# Patient Record
Sex: Male | Born: 1940
Health system: Southern US, Community
[De-identification: ages and names within clinical notes are randomized; demographics above are authoritative.]

## PROBLEM LIST (undated history)

## (undated) DIAGNOSIS — T82868A Thrombosis of vascular prosthetic devices, implants and grafts, initial encounter: Secondary | ICD-10-CM

## (undated) DIAGNOSIS — I2510 Atherosclerotic heart disease of native coronary artery without angina pectoris: Secondary | ICD-10-CM

## (undated) DIAGNOSIS — I10 Essential (primary) hypertension: Secondary | ICD-10-CM

## (undated) HISTORY — PX: HERNIA REPAIR: SHX51

---

## 2015-03-22 ENCOUNTER — Inpatient Hospital Stay (HOSPITAL_COMMUNITY): Payer: Medicare Other

## 2015-03-22 ENCOUNTER — Emergency Department (HOSPITAL_COMMUNITY): Payer: Medicare Other

## 2015-03-22 ENCOUNTER — Encounter (HOSPITAL_COMMUNITY): Payer: Self-pay | Admitting: Emergency Medicine

## 2015-03-22 DIAGNOSIS — E872 Acidosis, unspecified: Secondary | ICD-10-CM | POA: Insufficient documentation

## 2015-03-22 DIAGNOSIS — J810 Acute pulmonary edema: Secondary | ICD-10-CM | POA: Diagnosis present

## 2015-03-22 DIAGNOSIS — I10 Essential (primary) hypertension: Secondary | ICD-10-CM

## 2015-03-22 DIAGNOSIS — I213 ST elevation (STEMI) myocardial infarction of unspecified site: Principal | ICD-10-CM | POA: Diagnosis present

## 2015-03-22 DIAGNOSIS — T82868A Thrombosis of vascular prosthetic devices, implants and grafts, initial encounter: Secondary | ICD-10-CM

## 2015-03-22 DIAGNOSIS — G936 Cerebral edema: Secondary | ICD-10-CM | POA: Diagnosis present

## 2015-03-22 DIAGNOSIS — N179 Acute kidney failure, unspecified: Secondary | ICD-10-CM | POA: Diagnosis present

## 2015-03-22 DIAGNOSIS — I739 Peripheral vascular disease, unspecified: Secondary | ICD-10-CM | POA: Diagnosis present

## 2015-03-22 DIAGNOSIS — G931 Anoxic brain damage, not elsewhere classified: Secondary | ICD-10-CM | POA: Diagnosis present

## 2015-03-22 DIAGNOSIS — Z79899 Other long term (current) drug therapy: Secondary | ICD-10-CM | POA: Diagnosis not present

## 2015-03-22 DIAGNOSIS — Z66 Do not resuscitate: Secondary | ICD-10-CM | POA: Diagnosis present

## 2015-03-22 DIAGNOSIS — T82897A Other specified complication of cardiac prosthetic devices, implants and grafts, initial encounter: Secondary | ICD-10-CM

## 2015-03-22 DIAGNOSIS — I469 Cardiac arrest, cause unspecified: Secondary | ICD-10-CM

## 2015-03-22 DIAGNOSIS — R579 Shock, unspecified: Secondary | ICD-10-CM

## 2015-03-22 DIAGNOSIS — Z515 Encounter for palliative care: Secondary | ICD-10-CM | POA: Diagnosis present

## 2015-03-22 DIAGNOSIS — Z7902 Long term (current) use of antithrombotics/antiplatelets: Secondary | ICD-10-CM

## 2015-03-22 DIAGNOSIS — Z955 Presence of coronary angioplasty implant and graft: Secondary | ICD-10-CM | POA: Diagnosis not present

## 2015-03-22 DIAGNOSIS — R4020 Unspecified coma: Secondary | ICD-10-CM

## 2015-03-22 DIAGNOSIS — J9601 Acute respiratory failure with hypoxia: Secondary | ICD-10-CM | POA: Diagnosis present

## 2015-03-22 DIAGNOSIS — R40243 Glasgow coma scale score 3-8, unspecified time: Secondary | ICD-10-CM | POA: Diagnosis present

## 2015-03-22 DIAGNOSIS — G935 Compression of brain: Secondary | ICD-10-CM | POA: Diagnosis present

## 2015-03-22 DIAGNOSIS — I2510 Atherosclerotic heart disease of native coronary artery without angina pectoris: Secondary | ICD-10-CM

## 2015-03-22 HISTORY — DX: Essential (primary) hypertension: I10

## 2015-03-22 HISTORY — DX: Thrombosis due to vascular prosthetic devices, implants and grafts, initial encounter: T82.868A

## 2015-03-22 HISTORY — DX: Atherosclerotic heart disease of native coronary artery without angina pectoris: I25.10

## 2015-03-22 LAB — I-STAT CG4 LACTIC ACID, ED: Lactic Acid, Venous: 12.96 mmol/L (ref 0.5–2.0)

## 2015-03-22 LAB — CBC WITH DIFFERENTIAL/PLATELET
BASOS ABS: 0 10*3/uL (ref 0.0–0.1)
Basophils Relative: 1 %
Eosinophils Absolute: 0 10*3/uL (ref 0.0–0.7)
Eosinophils Relative: 0 %
HEMATOCRIT: 39.7 % (ref 39.0–52.0)
HEMOGLOBIN: 13.4 g/dL (ref 13.0–17.0)
LYMPHS PCT: 18 %
Lymphs Abs: 1.5 10*3/uL (ref 0.7–4.0)
MCH: 34.4 pg — AB (ref 26.0–34.0)
MCHC: 33.8 g/dL (ref 30.0–36.0)
MCV: 102.1 fL — AB (ref 78.0–100.0)
MONO ABS: 0.3 10*3/uL (ref 0.1–1.0)
Monocytes Relative: 3 %
NEUTROS ABS: 6.5 10*3/uL (ref 1.7–7.7)
NEUTROS PCT: 78 %
Platelets: 93 10*3/uL — ABNORMAL LOW (ref 150–400)
RBC: 3.89 MIL/uL — ABNORMAL LOW (ref 4.22–5.81)
RDW: 13.1 % (ref 11.5–15.5)
WBC: 8.3 10*3/uL (ref 4.0–10.5)

## 2015-03-22 LAB — BASIC METABOLIC PANEL
Anion gap: 13 (ref 5–15)
BUN: 21 mg/dL — AB (ref 6–20)
CO2: 19 mmol/L — ABNORMAL LOW (ref 22–32)
CREATININE: 2.11 mg/dL — AB (ref 0.61–1.24)
Calcium: 6.2 mg/dL — CL (ref 8.9–10.3)
Chloride: 107 mmol/L (ref 101–111)
GFR, EST AFRICAN AMERICAN: 35 mL/min — AB (ref >60)
GFR, EST NON AFRICAN AMERICAN: 30 mL/min — AB (ref >60)
Glucose, Bld: 335 mg/dL — ABNORMAL HIGH (ref 65–99)
POTASSIUM: 6.3 mmol/L — AB (ref 3.5–5.1)
SODIUM: 139 mmol/L (ref 135–145)

## 2015-03-22 LAB — COMPREHENSIVE METABOLIC PANEL
ALT: 54 U/L (ref 17–63)
AST: 73 U/L — AB (ref 15–41)
Albumin: 2.7 g/dL — ABNORMAL LOW (ref 3.5–5.0)
Alkaline Phosphatase: 61 U/L (ref 38–126)
Anion gap: 20 — ABNORMAL HIGH (ref 5–15)
BUN: 19 mg/dL (ref 6–20)
CHLORIDE: 104 mmol/L (ref 101–111)
CO2: 13 mmol/L — ABNORMAL LOW (ref 22–32)
Calcium: 8.6 mg/dL — ABNORMAL LOW (ref 8.9–10.3)
Creatinine, Ser: 2.01 mg/dL — ABNORMAL HIGH (ref 0.61–1.24)
GFR calc Af Amer: 37 mL/min — ABNORMAL LOW (ref >60)
GFR, EST NON AFRICAN AMERICAN: 32 mL/min — AB (ref >60)
Glucose, Bld: 363 mg/dL — ABNORMAL HIGH (ref 65–99)
POTASSIUM: 5 mmol/L (ref 3.5–5.1)
Sodium: 137 mmol/L (ref 135–145)
Total Bilirubin: 0.6 mg/dL (ref 0.3–1.2)
Total Protein: 4.9 g/dL — ABNORMAL LOW (ref 6.5–8.1)

## 2015-03-22 LAB — I-STAT ARTERIAL BLOOD GAS, ED
Acid-base deficit: 17 mmol/L — ABNORMAL HIGH (ref 0.0–2.0)
Bicarbonate: 16.7 mEq/L — ABNORMAL LOW (ref 20.0–24.0)
O2 Saturation: 97 %
PCO2 ART: 79.2 mmHg — AB (ref 35.0–45.0)
PH ART: 6.931 — AB (ref 7.350–7.450)
TCO2: 19 mmol/L (ref 0–100)
pO2, Arterial: 149 mmHg — ABNORMAL HIGH (ref 80.0–100.0)

## 2015-03-22 LAB — BRAIN NATRIURETIC PEPTIDE: B NATRIURETIC PEPTIDE 5: 885.4 pg/mL — AB (ref 0.0–100.0)

## 2015-03-22 LAB — TROPONIN I: TROPONIN I: 8.36 ng/mL — AB (ref <0.031)

## 2015-03-22 LAB — I-STAT CHEM 8, ED
BUN: 27 mg/dL — ABNORMAL HIGH (ref 6–20)
CREATININE: 1.7 mg/dL — AB (ref 0.61–1.24)
Calcium, Ion: 1.09 mmol/L — ABNORMAL LOW (ref 1.13–1.30)
Chloride: 104 mmol/L (ref 101–111)
Glucose, Bld: 344 mg/dL — ABNORMAL HIGH (ref 65–99)
HEMATOCRIT: 43 % (ref 39.0–52.0)
HEMOGLOBIN: 14.6 g/dL (ref 13.0–17.0)
Potassium: 4.8 mmol/L (ref 3.5–5.1)
SODIUM: 134 mmol/L — AB (ref 135–145)
TCO2: 14 mmol/L (ref 0–100)

## 2015-03-22 LAB — I-STAT TROPONIN, ED: Troponin i, poc: 0.29 ng/mL (ref 0.00–0.08)

## 2015-03-22 LAB — PROTIME-INR
INR: 1.86 — ABNORMAL HIGH (ref 0.00–1.49)
INR: 2.05 — AB (ref 0.00–1.49)
PROTHROMBIN TIME: 21.3 s — AB (ref 11.6–15.2)
PROTHROMBIN TIME: 23 s — AB (ref 11.6–15.2)

## 2015-03-22 LAB — CBG MONITORING, ED: Glucose-Capillary: 308 mg/dL — ABNORMAL HIGH (ref 65–99)

## 2015-03-22 LAB — MRSA PCR SCREENING: MRSA by PCR: NEGATIVE

## 2015-03-22 LAB — APTT
APTT: 67 s — AB (ref 24–37)
aPTT: 62 seconds — ABNORMAL HIGH (ref 24–37)

## 2015-03-22 MED ORDER — MORPHINE SULFATE 25 MG/ML IV SOLN
10.0000 mg/h | INTRAVENOUS | Status: DC
  Administered 2015-03-22: 10 mg/h via INTRAVENOUS
  Filled 2015-03-22: qty 10

## 2015-03-22 MED ORDER — SODIUM BICARBONATE 8.4 % IV SOLN
INTRAVENOUS | Status: AC
  Administered 2015-03-22: 12:00:00
  Filled 2015-03-22: qty 50

## 2015-03-22 MED ORDER — SODIUM CHLORIDE 0.9 % IV SOLN: INTRAVENOUS | Status: DC

## 2015-03-22 MED ORDER — ASPIRIN 300 MG RE SUPP
300.0000 mg | RECTAL | Status: DC
Start: 2015-03-22 — End: 2015-03-22

## 2015-03-22 MED ORDER — MORPHINE BOLUS VIA INFUSION
5.0000 mg | INTRAVENOUS | Status: DC | PRN
  Filled 2015-03-22: qty 20

## 2015-03-22 MED ORDER — SODIUM CHLORIDE 0.9 % IV SOLN
INTRAVENOUS | Status: AC | PRN
  Administered 2015-03-22: 1000 mL via INTRAVENOUS
  Administered 2015-03-22: 125 mL/h via INTRAVENOUS

## 2015-03-22 MED ORDER — PANTOPRAZOLE SODIUM 40 MG IV SOLR: 40.0000 mg | Freq: Every day | INTRAVENOUS | Status: DC

## 2015-03-22 MED ORDER — SODIUM BICARBONATE 8.4 % IV SOLN: 50.0000 meq | Freq: Once | INTRAVENOUS | Status: DC

## 2015-03-22 MED ORDER — SODIUM BICARBONATE 8.4 % IV SOLN
INTRAVENOUS | Status: DC
  Administered 2015-03-22: 14:00:00 via INTRAVENOUS
  Filled 2015-03-22 (×2): qty 150

## 2015-03-22 MED ORDER — NOREPINEPHRINE BITARTRATE 1 MG/ML IV SOLN
0.0000 ug/min | INTRAVENOUS | Status: DC
  Administered 2015-03-22: 10 ug/min via INTRAVENOUS
  Filled 2015-03-22: qty 4

## 2015-03-22 MED ORDER — SODIUM CHLORIDE 0.9 % IV SOLN: 2000.0000 mL | Freq: Once | INTRAVENOUS | Status: DC

## 2015-03-22 MED ORDER — MIDAZOLAM HCL 2 MG/2ML IJ SOLN: 1.0000 mg | INTRAMUSCULAR | Status: DC | PRN

## 2015-03-22 MED ORDER — INSULIN ASPART 100 UNIT/ML ~~LOC~~ SOLN: 0.0000 [IU] | SUBCUTANEOUS | Status: DC

## 2015-03-22 MED ORDER — FENTANYL CITRATE (PF) 100 MCG/2ML IJ SOLN: 50.0000 ug | INTRAMUSCULAR | Status: DC | PRN

## 2015-03-22 MED ORDER — EPINEPHRINE HCL 0.1 MG/ML IJ SOSY
PREFILLED_SYRINGE | INTRAMUSCULAR | Status: AC | PRN
  Administered 2015-03-22: 0.5 mg via INTRAVENOUS
  Administered 2015-03-22: 1 mg via INTRAVENOUS
  Administered 2015-03-22: 0.5 mg via INTRAVENOUS
  Administered 2015-03-22: 1 via INTRAVENOUS
  Administered 2015-03-22 (×4): 0.5 mg via INTRAVENOUS
  Administered 2015-03-22: 1 via INTRAVENOUS

## 2015-03-22 MED ORDER — NOREPINEPHRINE BITARTRATE 1 MG/ML IV SOLN
0.0000 ug/min | INTRAVENOUS | Status: DC
  Administered 2015-03-22: 5 ug/min via INTRAVENOUS
  Filled 2015-03-22: qty 4

## 2015-03-22 MED ORDER — EPINEPHRINE HCL 1 MG/ML IJ SOLN
0.5000 ug/min | INTRAVENOUS | Status: DC
  Administered 2015-03-22: 10 ug/min via INTRAVENOUS
  Filled 2015-03-22 (×2): qty 4

## 2015-03-22 MED FILL — Medication: Qty: 1 | Status: AC

## 2015-03-23 LAB — HEMOGLOBIN A1C
Hgb A1c MFr Bld: 6.1 % — ABNORMAL HIGH (ref 4.8–5.6)
Mean Plasma Glucose: 128 mg/dL

## 2015-03-31 ENCOUNTER — Telehealth: Payer: Self-pay

## 2015-03-31 NOTE — Telephone Encounter (Signed)
On 03/31/2015 I received a death certificate from [employer] and [place]. The death certificate is for cremation.The patient is a a patient of Buyer, retailDoctor Ramaswamy.  The death certificate will be taken to the pulmonary unit at [facility] this am for signature. On 03/31/2015 I received the death certificate back from Doctor Ramaswamy. I got the death certificate ready and called the funeral home to let them know the death certificate is ready for pickup and also faxed them a copy per their request.

## 2015-04-21 NOTE — Code Documentation (Signed)
Family updated as to patient's status by MD Hyacinth MeekerMiller.

## 2015-04-21 NOTE — Progress Notes (Signed)
   04/11/2015 0942  Clinical Encounter Type  Visited With Family;Health care provider  Visit Type Initial;Critical Care  Referral From Nurse  Stress Factors  Family Stress Factors Lack of knowledge   Chaplain met with patient's wife and daughter and assisted with getting them a medical update and seeing the patient. Chaplain offered hospitality, support, and introduced spiritual care services. Chaplain support available as needed.   Jeri Lager, Chaplain 04-11-2015 9:44 AM

## 2015-04-21 NOTE — Progress Notes (Signed)
Wasted 240cc of Morphine Iv in the sink. Waste of 240cc Morphine witnessed by Kavin LeechSondra Lockie Bothun RN

## 2015-04-21 NOTE — ED Provider Notes (Signed)
CSN: [id number]     Arrival date & time 04/15/15  5621 History   First MD Initiated Contact with Patient 04-15-2015 [phone]     Chief Complaint  Patient presents with  . Cardiac Arrest     (Consider location/radiation/quality/duration/timing/severity/associated sxs/prior Treatment) HPI Comments: The patient is a 74 year old male, he has a history of vascular disease requiring coronary artery stenting, femoral-femoral bypass, is on cholesterol medication as well as Plavix. Family states that last night he was having some difficulty breathing with congestion in his chest which has been persistent throughout the evening, he awoke this morning, was ambulatory, went to the bathroom, several minutes later he was checked on by his wife who stated that they could not get a hold of him, he would not respond, they barged into the bathroom pushing him out of the way as he was on the floor and found that he had no pulses, he was not breathing, they called paramedics. Paramedics found the patient to be in asystole, CPR was started, epinephrine was given 4 doses, he then had ventricular fibrillation and underwent defibrillation. He had successful return of spontaneous circulation though he did lose pulses several times in route. The patient is in severe distress, he is on ventilatory support on arrival with a King airway, he is unable to chew be to history. Family members fill some of the gaps but state that he had no chest pain, no shortness of breath, no recent fevers. Level V caveat apply secondary to severe illness  The history is provided by the patient.    Past Medical History  Diagnosis Date  . Coronary artery disease   . Femoral-femoral bypass graft thrombosis, left (HCC)   . Hypertension     femoral stent right    Past Surgical History  Procedure Laterality Date  . Hernia repair     History reviewed. No pertinent family history. Social History  Substance Use Topics  . Smoking status: None  .  Smokeless tobacco: None  . Alcohol Use: None    Review of Systems  Unable to perform ROS: Patient unresponsive      Allergies  Review of patient's allergies indicates no known allergies.  Home Medications   Prior to Admission medications   Medication Sig Start Date End Date Taking? Authorizing Provider  amLODipine (NORVASC) 10 MG tablet Take 10 mg by mouth daily.   Yes Historical Provider, MD  carvedilol (COREG) 25 MG tablet Take 25 mg by mouth 2 (two) times daily with a meal.   Yes Historical Provider, MD  clopidogrel (PLAVIX) 75 MG tablet Take 75 mg by mouth daily.   Yes Historical Provider, MD  rosuvastatin (CRESTOR) 10 MG tablet Take 10 mg by mouth daily.   Yes Historical Provider, MD  valsartan (DIOVAN) 160 MG tablet Take 160 mg by mouth daily.   Yes Historical Provider, MD   BP 114/48 mmHg  Pulse 35  Temp(Src) 90.5 F (32.5 C) (Tympanic)  Resp 28  Ht  (1.778 m)  Wt 220 lb (99.791 kg)  BMI 31.57 kg/m2  SpO2 100% Physical Exam  Constitutional:  Unresponsive, severe distress  HENT:  Oropharynx clear, clear secretions, bloody secretions, dentition intact  Eyes:  Conjunctiva clear, pupils dilated, minimally reactive  Neck:  Prior surgical scar on the right neck, pulses palpable  Cardiovascular:  Decreased heart sounds, regular rate, regular rhythm  Pulmonary/Chest:  Rhonchi auscultated bilaterally with ventilatory support  Abdominal:  Abdomen soft, nondistended, no masses  Musculoskeletal:  No peripheral  edema, intraosseous line placed prior to hospital arrival  Neurological:  Unresponsive  Skin:  Skin intact, no diaphoresis, no bleeding    ED Course  .Central Line Date/Time: Mar 23, 2015 3:55 PM Performed by: Eber Hong Authorized by: Eber Hong Consent: The procedure was performed in an emergent situation. Consent given by: patient Patient understanding: patient states understanding of the procedure being performed Imaging studies: imaging  studies available Required items: required blood products, implants, devices, and special equipment available Time out: Immediately prior to procedure a "time out" was called to verify the correct patient, procedure, equipment, support staff and site/side marked as required. Indications: vascular access Patient sedated: no Preparation: skin prepped with ChloraPrep Skin prep agent dried: skin prep agent completely dried prior to procedure Sterile barriers: all five maximum sterile barriers used - cap, mask, sterile gown, sterile gloves, and large sterile sheet Hand hygiene: hand hygiene performed prior to central venous catheter insertion Location details: left internal jugular Patient position: reverse Trendelenburg Catheter type: triple lumen Pre-procedure: landmarks identified Ultrasound guidance: yes Sterile ultrasound techniques: sterile gel and sterile probe covers were used Number of attempts: 1 Successful placement: yes Post-procedure: line sutured and dressing applied Assessment: blood return through all ports,  placement verified by x-ray,  no pneumothorax on x-ray and free fluid flow Patient tolerance: Patient tolerated the procedure well with no immediate complications   (including critical care time) Labs Review Labs Reviewed  APTT - Abnormal; Notable for the following:    aPTT 67 (*)    All other components within normal limits  PROTIME-INR - Abnormal; Notable for the following:    Prothrombin Time 21.3 (*)    INR 1.86 (*)    All other components within normal limits  COMPREHENSIVE METABOLIC PANEL - Abnormal; Notable for the following:    CO2 13 (*)    Glucose, Bld 363 (*)    Creatinine, Ser 2.01 (*)    Calcium 8.6 (*)    Total Protein 4.9 (*)    Albumin 2.7 (*)    AST 73 (*)    GFR calc non Af Amer 32 (*)    GFR calc Af Amer 37 (*)    Anion gap 20 (*)    All other components within normal limits  BRAIN NATRIURETIC PEPTIDE - Abnormal; Notable for the following:     B Natriuretic Peptide 885.4 (*)    All other components within normal limits  CBC WITH DIFFERENTIAL/PLATELET - Abnormal; Notable for the following:    RBC 3.89 (*)    MCV 102.1 (*)    MCH 34.4 (*)    Platelets 93 (*)    All other components within normal limits  TROPONIN I - Abnormal; Notable for the following:    Troponin I 8.36 (*)    All other components within normal limits  BASIC METABOLIC PANEL - Abnormal; Notable for the following:    Potassium 6.3 (*)    CO2 19 (*)    Glucose, Bld 335 (*)    BUN 21 (*)    Creatinine, Ser 2.11 (*)    Calcium 6.2 (*)    GFR calc non Af Amer 30 (*)    GFR calc Af Amer 35 (*)    All other components within normal limits  PROTIME-INR - Abnormal; Notable for the following:    Prothrombin Time 23.0 (*)    INR 2.05 (*)    All other components within normal limits  APTT - Abnormal; Notable for the following:    aPTT  62 (*)    All other components within normal limits  I-STAT TROPOININ, ED - Abnormal; Notable for the following:    Troponin i, poc 0.29 (*)    All other components within normal limits  I-STAT CHEM 8, ED - Abnormal; Notable for the following:    Sodium 134 (*)    BUN 27 (*)    Creatinine, Ser 1.70 (*)    Glucose, Bld 344 (*)    Calcium, Ion 1.09 (*)    All other components within normal limits  I-STAT CG4 LACTIC ACID, ED - Abnormal; Notable for the following:    Lactic Acid, Venous 12.96 (*)    All other components within normal limits  CBG MONITORING, ED - Abnormal; Notable for the following:    Glucose-Capillary 308 (*)    All other components within normal limits  I-STAT ARTERIAL BLOOD GAS, ED - Abnormal; Notable for the following:    pH, Arterial 6.931 (*)    pCO2 arterial 79.2 (*)    pO2, Arterial 149.0 (*)    Bicarbonate 16.7 (*)    Acid-base deficit 17.0 (*)    All other components within normal limits  MRSA PCR SCREENING  HEMOGLOBIN A1C    Imaging Review Ct Head Wo Contrast  04/13/2015  CLINICAL DATA:   Cardiac arrest, found down. EXAM: CT HEAD WITHOUT CONTRAST CT CERVICAL SPINE WITHOUT CONTRAST TECHNIQUE: Multidetector CT imaging of the head and cervical spine was performed following the standard protocol without intravenous contrast. Multiplanar CT image reconstructions of the cervical spine were also generated. COMPARISON:  None. FINDINGS: CT HEAD FINDINGS There is diffuse low density throughout the cerebral hemispheres compatible with global anoxic event and Edema. Low-density throughout the basal ganglia likely reflects edema as well. There is relative sparing of the cerebellum. No visible hemorrhage. There is loss of sulci and CSF spaces concerning for early herniation. No acute calvarial abnormality. Mucosal thickening throughout the paranasal sinuses. Mastoid air cells are clear. CT CERVICAL SPINE FINDINGS Normal alignment. Prevertebral soft tissues are normal. Mild anterior spurring throughout the cervical spine. No fracture. No epidural or paraspinal hematoma. IMPRESSION: Findings compatible with diffuse anoxic event with edema throughout the cerebral hemispheres bilaterally with relative sparing of the cerebellum. Suspicion for early herniation with loss of CSF spaces around the brainstem. No acute bony abnormality in the cervical spine. These results will be called to the ordering clinician or representative by the Radiologist Assistant, and communication documented in the PACS or zVision Dashboard. Electronically Signed   By: Charlett NoseKevin  Dover M.D.   On: 08/10/2014 11:41   Ct Cervical Spine Wo Contrast  03/27/2015  CLINICAL DATA:  Cardiac arrest, found down. EXAM: CT HEAD WITHOUT CONTRAST CT CERVICAL SPINE WITHOUT CONTRAST TECHNIQUE: Multidetector CT imaging of the head and cervical spine was performed following the standard protocol without intravenous contrast. Multiplanar CT image reconstructions of the cervical spine were also generated. COMPARISON:  None. FINDINGS: CT HEAD FINDINGS There is diffuse  low density throughout the cerebral hemispheres compatible with global anoxic event and Edema. Low-density throughout the basal ganglia likely reflects edema as well. There is relative sparing of the cerebellum. No visible hemorrhage. There is loss of sulci and CSF spaces concerning for early herniation. No acute calvarial abnormality. Mucosal thickening throughout the paranasal sinuses. Mastoid air cells are clear. CT CERVICAL SPINE FINDINGS Normal alignment. Prevertebral soft tissues are normal. Mild anterior spurring throughout the cervical spine. No fracture. No epidural or paraspinal hematoma. IMPRESSION: Findings compatible with diffuse anoxic event  with edema throughout the cerebral hemispheres bilaterally with relative sparing of the cerebellum. Suspicion for early herniation with loss of CSF spaces around the brainstem. No acute bony abnormality in the cervical spine. These results will be called to the ordering clinician or representative by the Radiologist Assistant, and communication documented in the PACS or zVision Dashboard. Electronically Signed   By: Charlett Nose M.D.   On: Mar 27, 2015 11:41   Dg Chest Portable 1 View  03-27-15  CLINICAL DATA:  Status post central line placement. EXAM: PORTABLE CHEST 1 VIEW COMPARISON:  Same day. FINDINGS: Stable cardiomediastinal silhouette. Endotracheal tube is in grossly good position. Nasogastric tube is seen entering stomach interval placement of left internal jugular catheter with distal tip in expected position of the SVC. Stable bilateral diffuse interstitial densities are noted most consistent with pulmonary edema. No pneumothorax is noted. The visualized skeletal structures are unremarkable. IMPRESSION: Endotracheal and nasogastric tubes in grossly good position. Interval placement of left internal jugular catheter with distal tip in expected position of SVC. Stable diffuse bilateral pulmonary edema. Electronically Signed   By: Lupita Raider, M.D.    On: 03/27/15 11:45   Dg Chest Port 1 View  27-Mar-2015  CLINICAL DATA:  Status post CPR. EXAM: PORTABLE CHEST 1 VIEW COMPARISON:  None. FINDINGS: Borderline cardiomegaly is noted. Endotracheal tube is seen projected over tracheal air shadow with distal tip approximately 5 cm above the carina. No pneumothorax or significant pleural effusion is noted. Bilateral perihilar and basilar interstitial densities are noted concerning for edema. Bony thorax is unremarkable. IMPRESSION: Endotracheal tube in grossly good position. Bilateral pulmonary edema is noted. Electronically Signed   By: Lupita Raider, M.D.   On: 03/27/2015 09:23   I have personally reviewed and evaluated these images and lab results as part of my medical decision-making.   EKG Interpretation None      MDM   Final diagnoses:  Cardiac arrest (HCC)  Acute renal failure, unspecified acute renal failure type (HCC)  Lactic acidosis    The patient was placed in a cervical collar on arrival, CPR was continued intermittently throughout his stay, I personally directed CPR, I intubated the patient for airway protection, he was a very difficult intubation and required kaleidoscope and a smaller endotracheal tube. He had a very large epiglottis which was quite floppy. Chest x-ray reviewed, shows bilateral pulmonary edema. EKG reviewed, no signs of STEMI, labs reviewed, severe lactic acidosis, he also has a slightly elevated troponin and acute renal failure. The etiology of the patient's severe decompensation is unclear. It would be suspect that he had cardiac etiology given his severe vascular disease though there is no clear correlation based on testing at this point. Care was discussed with the critical care team, they will come to admit the patient. He has continued to require intermittent doses of epinephrine and intermittent periods of CPR. We are continuing the cooling process with ice packs. The patient is severely critically ill. He will  go to the intensive care unit.  INTUBATION Performed by: Vida Roller  Required items: required blood products, implants, devices, and special equipment available Patient identity confirmed: provided demographic data and hospital-assigned identification number Time out: Immediately prior to procedure a "time out" was called to verify the correct patient, procedure, equipment, support staff and site/side marked as required.  Indications: respiratory failure  Intubation method: Glidescope Laryngoscopy   Preoxygenation: BVM  Sedatives: 20mg  Etomidate Paralytic: 100mg  Succinylcholine  Tube Size: 7.0 cuffed  Post-procedure assessment: chest  rise and ETCO2 monitor Breath sounds: equal and absent over the epigastrium Tube secured with: ETT holder Chest x-ray interpreted by radiologist and me.  Chest x-ray findings: endotracheal tube in appropriate position  Patient tolerated the procedure well with no immediate complications.   Cardiopulmonary Resuscitation (CPR) Procedure Note Directed/Performed by: Vida Roller I personally directed ancillary staff and/or performed CPR in an effort to regain return of spontaneous circulation and to maintain cardiac, neuro and systemic perfusion.   CRITICAL CARE Performed by: Vida Roller Total critical care time: 35  minutes Critical care time was exclusive of separately billable procedures and treating other patients. Critical care was necessary to treat or prevent imminent or life-threatening deterioration. Critical care was time spent personally by me on the following activities: development of treatment plan with patient and/or surrogate as well as nursing, discussions with consultants, evaluation of patient's response to treatment, examination of patient, obtaining history from patient or surrogate, ordering and performing treatments and interventions, ordering and review of laboratory studies, ordering and review of radiographic studies,  pulse oximetry and re-evaluation of patient's condition.     Eber Hong, MD 04/03/2015 [phone]

## 2015-04-21 NOTE — ED Notes (Signed)
Pt CBG, 308. Nurse was notified.

## 2015-04-21 NOTE — ED Notes (Signed)
Patient transported to CT 

## 2015-04-21 NOTE — ED Notes (Signed)
Lab called about CBC not resulted. Lab states they only received one purple top. Phlebotomy states they sent down two for both BNP and CBC. Ben RN drawing a third tube to send down for CBC.

## 2015-04-21 NOTE — H&P (Signed)
PULMONARY / CRITICAL CARE MEDICINE   Name: Phillips OdorDavid Giaimo MRN: [id number] DOB: 06/16/1940    ADMISSION DATE:  03/25/2015   REFERRING MD :  EDP  CHIEF COMPLAINT:  Cardiac arrest  INITIAL PRESENTATION: Asystole    STUDIES:  11/1 ct head>> 11/1 eeg>> 11/1 2 d >>  SIGNIFICANT EVENTS: 11/1 asystolic arrest    HISTORY OF PRESENT ILLNESS:   74 yo wm with little pmh available ex\cept for cad with tent, left fem/fem bypass who was found down in bathroom by wife in asystole fr short unknown time. EMS responded an d CPR was continued, Vfib shocked x 1 and rosc was noted. Intubated and left ij cvl placed by EDP. PCCM asked to admit for hypothermia protocol.  He will need CT of head and cards consult. Note he was reported to have difficulty breathing during the night with congested cough.  PAST MEDICAL HISTORY :   has a past medical history of Coronary artery disease; Femoral-femoral bypass graft thrombosis, left (HCC); and Hypertension.  has past surgical history that includes Hernia repair. Prior to Admission medications   Medication Sig Start Date End Date Taking? Authorizing Provider  amLODipine (NORVASC) 10 MG tablet Take 10 mg by mouth daily.   Yes Historical Provider, MD  carvedilol (COREG) 25 MG tablet Take 25 mg by mouth 2 (two) times daily with a meal.   Yes Historical Provider, MD  clopidogrel (PLAVIX) 75 MG tablet Take 75 mg by mouth daily.   Yes Historical Provider, MD  rosuvastatin (CRESTOR) 10 MG tablet Take 10 mg by mouth daily.   Yes Historical Provider, MD  valsartan (DIOVAN) 160 MG tablet Take 160 mg by mouth daily.   Yes Historical Provider, MD   No Known Allergies  FAMILY HISTORY:  has no family status information on file.  SOCIAL HISTORY:    REVIEW OF SYSTEMS:  NA  SUBJECTIVE:   VITAL SIGNS: Temp:  [96.6 F (35.9 C)] 96.6 F (35.9 C) (11/01 0855) Pulse Rate:  [56-87] 72 (11/01 0930) Resp:  [18] 18 (11/01 0930) BP: (85-201)/(42-150) 194/80 mmHg (11/01  0930) SpO2:  [95 %-100 %] 100 % (11/01 0930) FiO2 (%):  [100 %] 100 % (11/01 0853) HEMODYNAMICS:   VENTILATOR SETTINGS: Vent Mode:  [-] PRVC FiO2 (%):  [100 %] 100 % Set Rate:  [18 bmp] 18 bmp Vt Set:  [409[620 mL] 620 mL PEEP:  [5 cmH20] 5 cmH20 Plateau Pressure:  [12 cmH20] 12 cmH20 INTAKE / OUTPUT: No intake or output data in the 24 hours ending 03/28/2015 1008  PHYSICAL EXAMINATION: General:  EWM on vent , no sedation Neuro:  No sedation, , neg doll's eyes, intubated without sedation, no gag HEENT: Pupils nr at 5 mm, ott->vent, nares with fluid Cardiovascular: HSR RRR bradycardia Lungs:  bibasilar crackles  Abdomen:  Soft, n no bs Musculoskeletal:  intact Skin: cool, no edema  LABS:  CBC  Recent Labs Lab 03/28/2015 0902  HGB 14.6  HCT 43.0   Coag's  Recent Labs Lab 03/28/2015 0855  APTT 67*  INR 1.86*   BMET  Recent Labs Lab 03/28/2015 0855 03/28/2015 0902  NA 137 134*  K 5.0 4.8  CL 104 104  CO2 13*  --   BUN 19 27*  CREATININE 2.01* 1.70*  GLUCOSE 363* 344*   Electrolytes  Recent Labs Lab 03/28/2015 0855  CALCIUM 8.6*   Sepsis Markers  Recent Labs Lab 03/28/2015 0932  LATICACIDVEN 12.96*   ABG No results for input(s): PHART, PCO2ART, PO2ART in  the last 168 hours. Liver Enzymes  Recent Labs Lab 2015-04-02 0855  AST 73*  ALT 54  ALKPHOS 61  BILITOT 0.6  ALBUMIN 2.7*   Cardiac Enzymes No results for input(s): TROPONINI, PROBNP in the last 168 hours. Glucose  Recent Labs Lab 02-Apr-2015 0923  GLUCAP 308*    Imaging Dg Chest Port 1 View  04-02-2015  CLINICAL DATA:  Status post CPR. EXAM: PORTABLE CHEST 1 VIEW COMPARISON:  None. FINDINGS: Borderline cardiomegaly is noted. Endotracheal tube is seen projected over tracheal air shadow with distal tip approximately 5 cm above the carina. No pneumothorax or significant pleural effusion is noted. Bilateral perihilar and basilar interstitial densities are noted concerning for edema. Bony thorax is  unremarkable. IMPRESSION: Endotracheal tube in grossly good position. Bilateral pulmonary edema is noted. Electronically Signed   By: Lupita Raider, M.D.   On: 04-02-2015 09:23     ASSESSMENT / PLAN:  PULMONARY OETT 11/1>> A: VDRF post arrest Possible URI P:   Vent bundle, rr increased to 28(air traps at higher rate) Pan culture if WBC elevated will add abx  CARDIOVASCULAR CVL 11/1 lt ij 11/1>> A:  Post v fib, asystole in setting of possible URI UKNOWN DOWN TIME Shock CAD PVD  P:  Normothermia protocol Pressor support  Correct acidosis  RENAL A:   Renal insuf P:   Follow creatine  GASTROINTESTINAL A: GI protection P:   PPI  HEMATOLOGIC A:   No known acute issues P:  Check cbc  INFECTIOUS A:   Reported congested cough, ?chf, possible URI P:   BCx211/1>> UC 11/1>> Sputum11/1>> Abx:   ENDOCRINE A:   Hyperglycemia, possible dm P:   SSI q 4 h  NEUROLOGIC A: \ Post arrest, unknown down time P:   RASS goal:-1 with sedation No sedation on board CT head   FAMILY  - Updates:   - Inter-disciplinary family meet or Palliative Care meeting due by:  day 7    TODAY'S SUMMARY:   74 yo wm with little pmh available ex\cept for cad with tent, left fem/fem bypass who was found down in bathroom by wife in asystole fr short unknown time. EMS responded an d CPR was continued, Vfib shocked x 1 and rosc was noted. Intubated and left ij cvl placed by EDP. PCCM asked to admit for hypothermia protocol.  He will need CT of head and cards consult. Note he was reported to have difficulty breathing during the night with congested cough.  Brett Canales Maybree Riling ACNP [facility] PCCM Pager [phone] till 3 pm If no answer page [phone] Apr 02, 2015, 10:15 AM

## 2015-04-21 NOTE — Progress Notes (Signed)
Code blue was called on patient and RT came to bedside. Dr. Marchelle Gearingamaswamy and Joneen RoachPaul Hoffman, NP at bedside. Patient was on a set RR of 28 and RT adjusted vent to a set RR to assess patient effort with MD and NP. Patient was not initiating any spontaneous breaths and was returned to a set RR of 28. Patient is currently on full support and RNs at bedside working with patient. RT is close by and will continue to monitor patient.

## 2015-04-21 NOTE — Progress Notes (Signed)
Asystole on monitor   Confirmed by auscultation for one minute by myself and [facility]vin Amo KuffourRN, absence of respirations, pupils fixed and dilated.  Spouse with patient.  Information obtained from spouse

## 2015-04-21 NOTE — Progress Notes (Signed)
eLink Physician-Brief Progress Note Patient Name: Jonathan OdorDavid Heidelberg DOB: 12/25/1940 MRN: [id number]   Date of Service  04/07/2015  HPI/Events of Note  Family aware prognosis is poor, per notes the patient would not want this level of aggressive care.  Spoke with wife over the phone, she clearly states that patient would only wish to be comfortable.  eICU Interventions  Withdrawal order set with morphine started and will withdraw ETT and switch to full comfort at this point.     Intervention Category Major Interventions: Other:  Sanam Marmo 04/03/2015, 7:10 PM

## 2015-04-21 NOTE — Progress Notes (Signed)
Extubation protocol initiated.

## 2015-04-21 NOTE — Consult Note (Signed)
Patient ID: Jonathan Pruitt MRN: [id number] DOB/AGE: 1941/04/28 74 y.o.  Admit date: 2015/04/20 Primary Physician unknown Primary Cardiologist unknown  Chief Complaint  Cardiac arrest  HPI: 67M with hypertension, hyperlipidemia, CAD (specifics unknown) and PAD s/p fem-pop bypass who presents with cardiac arrest.  No family is present and history is obtained second-hand from ED and Pulmonary staff.  Reportedly his wife last saw him this AM before leaving the house briefly.  Upon her return he was unresponsive in the bathroom.  They broke the door down and EMS was called.  Initial rhythm was reportedly asystole.  He received CPR and epinephrine with resultant VF.  He was defibrillated in the field with ROSC.  He was intubated without sedation and had GCS 3.    Upon arrival in the ED he was intubated and non-responsive.  He was started on epinephrine and levophed.  Glucose was >300.  Labs were notable for pH 6.9, pCO2 79.2, PO2 149.  WBC 8.3, Hgb 13.4.  Na 134, K 4.8, creatinine 1.4, Glu 344.  Lactate 13.  Troponin 0.29.    Reportedly he was feeling poorly yesterday and had a cough.   Review of Systems:  Unable to obtain  Past Medical History  Diagnosis Date  . Coronary artery disease   . Femoral-femoral bypass graft thrombosis, left (HCC)   . Hypertension     femoral stent right      (Not in a hospital admission)   . aspirin  300 mg Rectal NOW  . insulin aspart  0-15 Units Subcutaneous 6 times per day  . pantoprazole (PROTONIX) IV  40 mg Intravenous QHS    Infusions: . sodium chloride 125 mL/hr (04-20-15 0929)  . sodium chloride    . norepinephrine (LEVOPHED) Adult infusion      No Known Allergies  Social History   Social History  . Marital Status: N/A    Spouse Name: N/A  . Number of Children: N/A  . Years of Education: N/A   Occupational History  . Not on file.   Social History Main Topics  . Smoking status: Not on file  . Smokeless tobacco: Not on file  .  Alcohol Use: Not on file  . Drug Use: Not on file  . Sexual Activity: Not on file   Other Topics Concern  . Not on file   Social History Narrative  . No narrative on file    No family history on file.  PHYSICAL EXAM: Filed Vitals:   2015/04/20 1049  BP: 198/70  Pulse: 65  Temp: 93.7 F (34.3 C)  Resp: 28     Intake/Output Summary (Last 24 hours) at 04-20-15 1107 Last data filed at April 20, 2015 1013  Gross per 24 hour  Intake   1500 ml  Output      0 ml  Net   1500 ml    Gen: Critically ill.  Intubated and not sedated. Neck: Unable to assess JVD. CV: Regular rhythm.  Bradycardic.  No m/r/g. Lungs: Diffuse crackles anteriorly.  Ventilated. Abd: Absent bowel sounds. Ext:  Cool. No edema.  Results for orders placed or performed during the hospital encounter of 04/20/15 (from the past 24 hour(s))  APTT     Status: Abnormal   Collection Time: Apr 20, 2015  8:55 AM  Result Value Ref Range   aPTT 67 (H) 24 - 37 seconds  Protime-INR     Status: Abnormal   Collection Time: 04-20-15  8:55 AM  Result Value Ref Range  Prothrombin Time 21.3 (H) 11.6 - 15.2 seconds   INR 1.86 (H) 0.00 - 1.49  Comprehensive metabolic panel     Status: Abnormal   Collection Time: 2015-02-01  8:55 AM  Result Value Ref Range   Sodium 137 135 - 145 mmol/L   Potassium 5.0 3.5 - 5.1 mmol/L   Chloride 104 101 - 111 mmol/L   CO2 13 (L) 22 - 32 mmol/L   Glucose, Bld 363 (H) 65 - 99 mg/dL   BUN 19 6 - 20 mg/dL   Creatinine, Ser 4.132.01 (H) 0.61 - 1.24 mg/dL   Calcium 8.6 (L) 8.9 - 10.3 mg/dL   Total Protein 4.9 (L) 6.5 - 8.1 g/dL   Albumin 2.7 (L) 3.5 - 5.0 g/dL   AST 73 (H) 15 - 41 U/L   ALT 54 17 - 63 U/L   Alkaline Phosphatase 61 38 - 126 U/L   Total Bilirubin 0.6 0.3 - 1.2 mg/dL   GFR calc non Af Amer 32 (L) >60 mL/min   GFR calc Af Amer 37 (L) >60 mL/min   Anion gap 20 (H) 5 - 15  Brain natriuretic peptide     Status: Abnormal   Collection Time: 2015-02-01  8:55 AM  Result Value Ref Range   B  Natriuretic Peptide 885.4 (H) 0.0 - 100.0 pg/mL  I-Stat Troponin, ED (not at [facility], [facility])     Status: Abnormal   Collection Time: 2015-02-01  9:00 AM  Result Value Ref Range   Troponin i, poc 0.29 (HH) 0.00 - 0.08 ng/mL   Comment NOTIFIED PHYSICIAN    Comment 3          I-Stat Chem 8, ED  (not at [facility], [facility])     Status: Abnormal   Collection Time: 2015-02-01  9:02 AM  Result Value Ref Range   Sodium 134 (L) 135 - 145 mmol/L   Potassium 4.8 3.5 - 5.1 mmol/L   Chloride 104 101 - 111 mmol/L   BUN 27 (H) 6 - 20 mg/dL   Creatinine, Ser 2.441.70 (H) 0.61 - 1.24 mg/dL   Glucose, Bld 010344 (H) 65 - 99 mg/dL   Calcium, Ion 2.721.09 (L) 1.13 - 1.30 mmol/L   TCO2 14 0 - 100 mmol/L   Hemoglobin 14.6 13.0 - 17.0 g/dL   HCT 53.643.0 64.439.0 - 03.452.0 %  CBG monitoring, ED     Status: Abnormal   Collection Time: 2015-02-01  9:23 AM  Result Value Ref Range   Glucose-Capillary 308 (H) 65 - 99 mg/dL  I-Stat CG4 Lactic Acid, ED     Status: Abnormal   Collection Time: 2015-02-01  9:32 AM  Result Value Ref Range   Lactic Acid, Venous 12.96 (HH) 0.5 - 2.0 mmol/L   Comment NOTIFIED PHYSICIAN   CBC with Differential     Status: Abnormal (Preliminary result)   Collection Time: 2015-02-01 10:05 AM  Result Value Ref Range   WBC 8.3 4.0 - 10.5 K/uL   RBC 3.89 (L) 4.22 - 5.81 MIL/uL   Hemoglobin 13.4 13.0 - 17.0 g/dL   HCT 74.239.7 59.539.0 - 63.852.0 %   MCV 102.1 (H) 78.0 - 100.0 fL   MCH 34.4 (H) 26.0 - 34.0 pg   MCHC 33.8 30.0 - 36.0 g/dL   RDW 75.613.1 43.311.5 - 29.515.5 %   Platelets PENDING 150 - 400 K/uL   Neutrophils Relative % 78 %   Neutro Abs 6.5 1.7 - 7.7 K/uL   Lymphocytes Relative 18 %   Lymphs Abs  1.5 0.7 - 4.0 K/uL   Monocytes Relative 3 %   Monocytes Absolute 0.3 0.1 - 1.0 K/uL   Eosinophils Relative 0 %   Eosinophils Absolute 0.0 0.0 - 0.7 K/uL   Basophils Relative 1 %   Basophils Absolute 0.0 0.0 - 0.1 K/uL  I-Stat arterial blood gas, ED     Status: Abnormal   Collection Time: 2015-03-29 10:17 AM  Result Value Ref Range   pH,  Arterial 6.931 (LL) 7.350 - 7.450   pCO2 arterial 79.2 (HH) 35.0 - 45.0 mmHg   pO2, Arterial 149.0 (H) 80.0 - 100.0 mmHg   Bicarbonate 16.7 (L) 20.0 - 24.0 mEq/L   TCO2 19 0 - 100 mmol/L   O2 Saturation 97.0 %   Acid-base deficit 17.0 (H) 0.0 - 2.0 mmol/L   Patient temperature HIDE    Collection site RADIAL, ALLEN'S TEST ACCEPTABLE    Drawn by RT    Sample type ARTERIAL    Comment NOTIFIED PHYSICIAN    Dg Chest Port 1 View  03/29/15  CLINICAL DATA:  Status post CPR. EXAM: PORTABLE CHEST 1 VIEW COMPARISON:  None. FINDINGS: Borderline cardiomegaly is noted. Endotracheal tube is seen projected over tracheal air shadow with distal tip approximately 5 cm above the carina. No pneumothorax or significant pleural effusion is noted. Bilateral perihilar and basilar interstitial densities are noted concerning for edema. Bony thorax is unremarkable. IMPRESSION: Endotracheal tube in grossly good position. Bilateral pulmonary edema is noted. Electronically Signed   By: Lupita Raider, M.D.   On: 2015-03-29 09:23    ECG: sinus rhythm rate 60 bpm.  First degree heart block.  Inferior Q waves.   ASSESSMENT/PLAN:  # Cardiac arrest:  Prognosis poor.  It is unclear how long he was down.  Given his lactate of 13, it was probably prolonged.  No reflexes present and he reportedly has not received any sedation or neuromuscular blockade.  No ST elevations on EKG and troponin mildly elevated, which is likely due to CPR.  Etiology could be primarily respiratory given that he was feeling poorly and coughing yesterday.  However, WBC is normal.  BNP 885 and CXR concerning for edema.  Unable to diurese due to hypotension and pressor requirement. - Trend troponin x3 - No urgent indication for cardiac catheterization at this time - Consider cooling, though it is unclear how long he was down and does not sound like this was primary cardiac - Echo pending - Continue home ASA and plavix - Will continue to follow and  assess for neurologic recovery.  Will evaluate for ischemia when stable.   Signed:  Madilyn Hook, MD 29-Mar-2015, 11:07 AM   Addendum: Patient is actively dying.  Remains profoundly hypotensive and bradycardic despite pressors.  CT brain showed edema and impending herniation.  They have chosen to focus on comfort but are not withdrawing care.

## 2015-04-21 NOTE — Progress Notes (Signed)
As patient rolled into room PEA, CPR started. See code sheet.

## 2015-04-21 NOTE — Progress Notes (Signed)
Chaplain was called to Ed escorted patient's  family to 2 Heart waiting area.  Upon arriving the unit secretary informed chaplain that  patient was coding. Patient family was informed by doctor and shortly escorted to bedside. Patient is actively dying and family made it known to staff that patient have an Advance directives and a is DNR.  Family has had much conversation concerning this per patient request and previous hospital stays.  Family is accepting that their love one is nearing end of life. Chaplain provide empathetic listening, presence, hospitality,emotional and grief support. Will follow as needed. Nurse will page chaplain if further support is needed.  Pager [phone]

## 2015-04-21 NOTE — Discharge Summary (Signed)
DISCHARGE SUMMARY    Date of admit: 04/12/2015  8:50 AM Date of discharge: 03/26/2015  8:07 PM Length of Stay: 0 days  PCP is No primary care provider on file.   PROBLEM LIST Active Problems:   Cardiac arrest (HCC), found in asytole\, cpr. vfib with shock x 1, rosc - likely due to MI based on troponin   Coronary stent placement right   CAD (coronary artery disease)   Femoral-femoral bypass graft thrombosis, left (HCC)   HTN (hypertension)   DNR (do not resuscitate)   Acute respiratory failure with hypoxia (HCC)   Coma (HCC)   Shock circulatory (HCC)   Acute renal failure (HCC)   Lactic acidosis    SUMMARY Phillips OdorDavid Viens was 74 y.o. patient with    has a past medical history of Coronary artery disease; Femoral-femoral bypass graft thrombosis, left (HCC); and Hypertension.   has past surgical history that includes Hernia repair.   Admitted on 04/19/2015 with   [facility]. S/p fem po p bypas (s/p revision) and Carotid stent NOS. 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 Wife recently sick with cold and wheezing and patient developed same Lastnight. This AM was a desk near 8am. Wife stepped to garage for 2-4 minutes and on return found uncocinsious in bathroom. Initially had pulse but then lost it per wife. Had bystander CPR. Asystole on "quick EMS arrivaL". CPR on field with [facility] . CPR agai in ER and then in [facility] ICU. In between needing frequent epi bolus and significant hemodynamic instability. Patient comatose with siginficant pulmonary edema on exam and xray. Initial troponin normal but subsequent troponin high at 8.36 suggesting MI as diagnosis.  Patient also with AKI, Lactic acidosis 12, and Coma. CT head showed diffuse anoxic injury with suspicion of early herniation.  Discussions were held with family who based on prognosis and patient prior wishes and best interest opted for palliation. Patient then  expired 04/16/2015       SIGNED Dr. Kalman ShanMurali Marshal Eskew, M.D., [facility]P Pulmonary and Critical Care Medicine Staff Physician [facility] [place] [facility] Pager: [phone], If no answer or between  15:00h - 7:00h: call [phone]  03/30/2015 3:38 AM

## 2015-04-21 NOTE — ED Notes (Signed)
Per EMS- Called out for witnessed arrest. Call received at 756. Upon arrival at 0759 Pt was noted to have no pulses. CPR engaged. Pt was in asystole. Pt given 6 of epi, pt was then in vfib and received 1 shock. Pt given 2 more of epi and regained pulses. King airway established. D50 Given. IO placed to L tib fib. Iced Saline given,.

## 2015-04-21 DEATH — deceased

## 2017-04-07 IMAGING — CR DG CHEST 1V PORT
1 series · 1 of 1 positions shown · non-contrast
Comparison: None.

CLINICAL DATA: Status post CPR.

EXAM:
PORTABLE CHEST 1 VIEW

[AP]
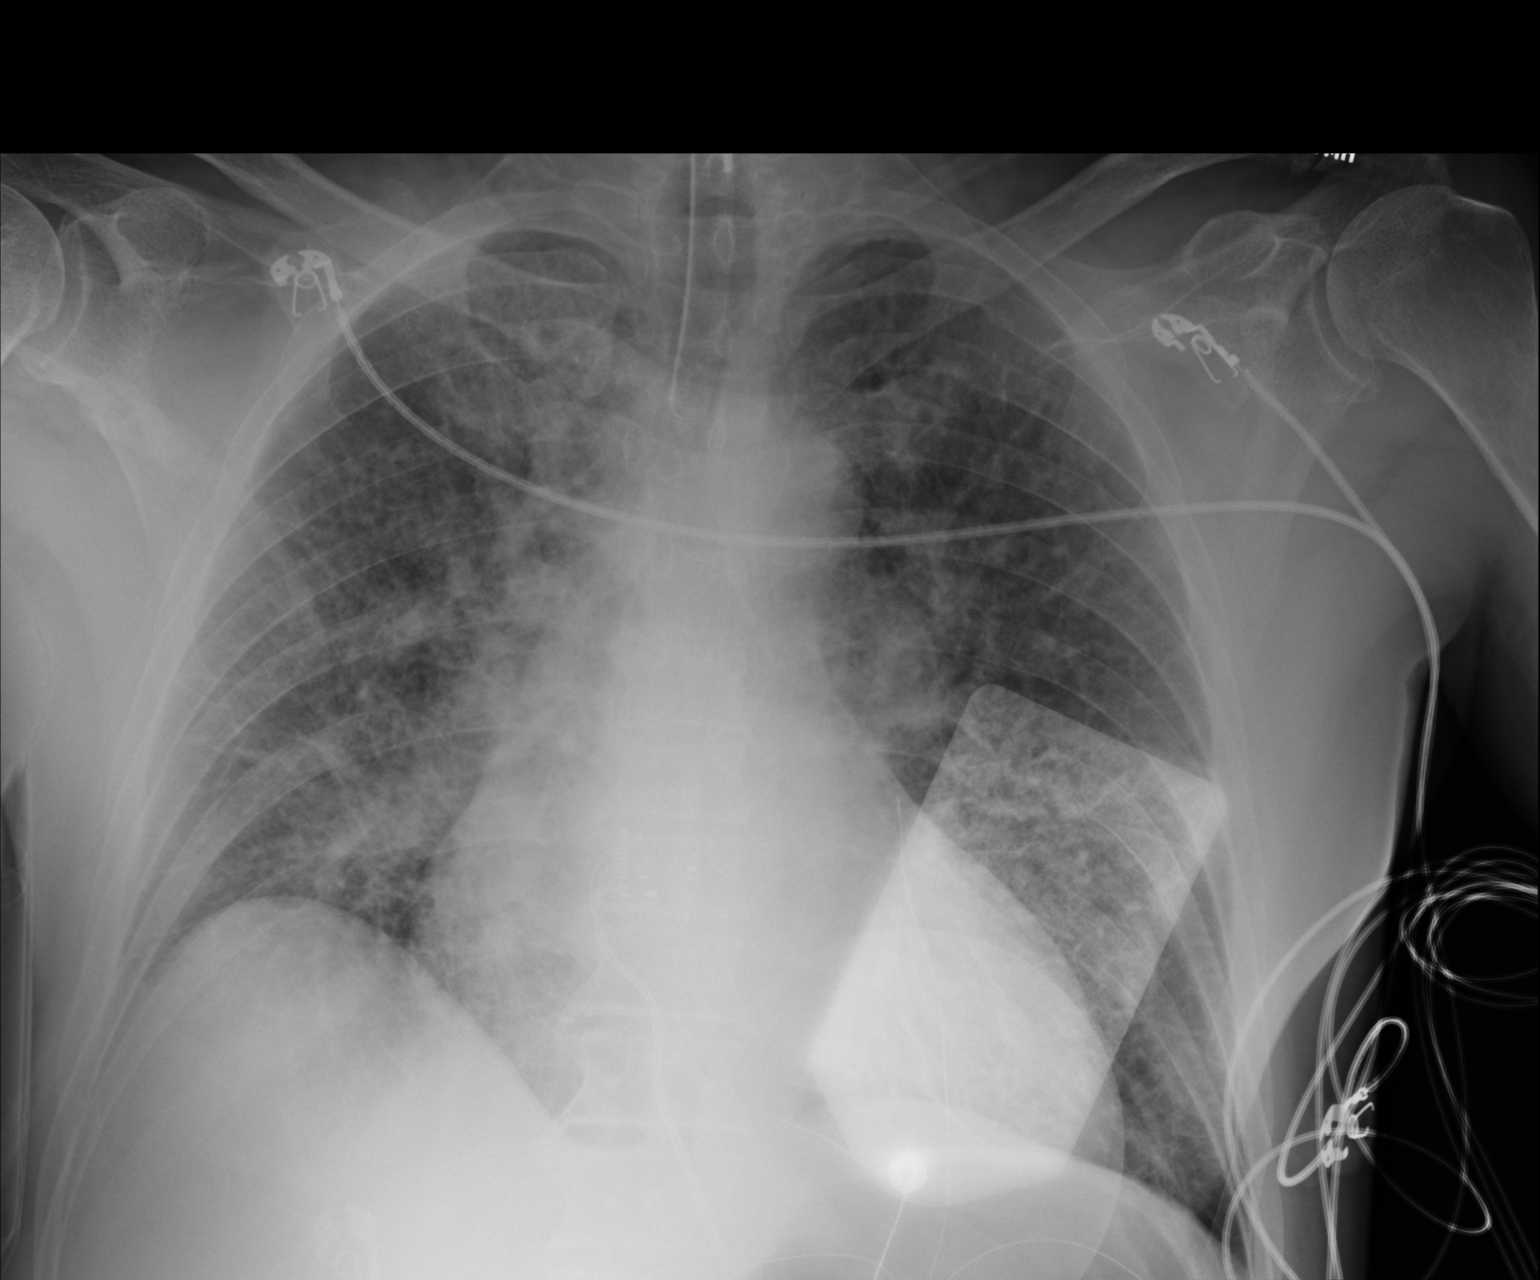

[1 of 1 positions shown; findings below may reference images not displayed]

FINDINGS: Borderline cardiomegaly is noted. Endotracheal tube is seen
projected over tracheal air shadow with distal tip approximately 5
cm above the carina. No pneumothorax or significant pleural effusion
is noted. Bilateral perihilar and basilar interstitial densities are
noted concerning for edema. Bony thorax is unremarkable.
IMPRESSION: Endotracheal tube in grossly good position. Bilateral pulmonary
edema is noted.

## 2017-04-07 IMAGING — CR DG CHEST 1V PORT
1 series · 1 of 1 positions shown · non-contrast
Comparison: Same day.

CLINICAL DATA: Status post central line placement.

EXAM:
PORTABLE CHEST 1 VIEW

[AP]
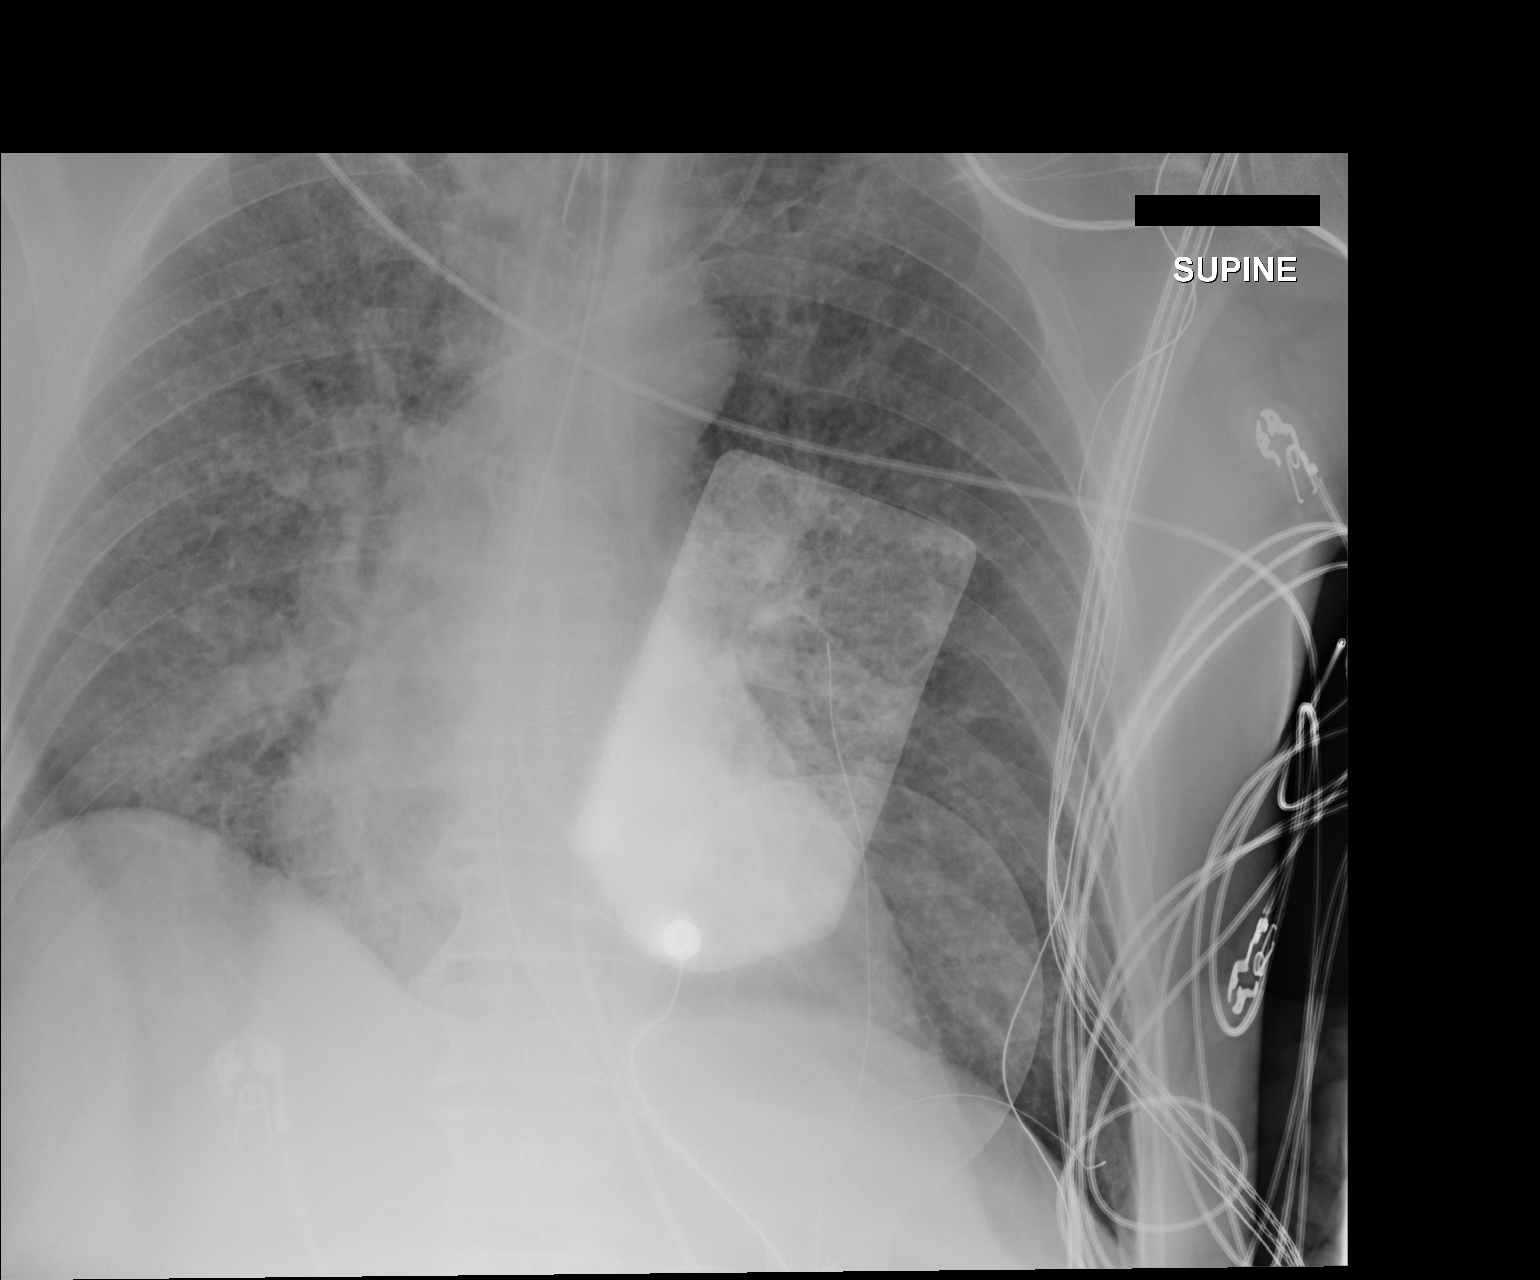

[1 of 1 positions shown; findings below may reference images not displayed]

FINDINGS: Stable cardiomediastinal silhouette. Endotracheal tube is in grossly
good position. Nasogastric tube is seen entering stomach interval
placement of left internal jugular catheter with distal tip in
expected position of the SVC. Stable bilateral diffuse interstitial
densities are noted most consistent with pulmonary edema. No
pneumothorax is noted. The visualized skeletal structures are
unremarkable.
IMPRESSION: Endotracheal and nasogastric tubes in grossly good position.
Interval placement of left internal jugular catheter with distal tip
in expected position of SVC. Stable diffuse bilateral pulmonary
edema.
